# Patient Record
Sex: Male | Born: 1955 | Race: White | Hispanic: No | Marital: Married
Health system: Southern US, Community
[De-identification: ages and names within clinical notes are randomized; demographics above are authoritative.]

---

## 2015-07-24 ENCOUNTER — Ambulatory Visit
Admission: RE | Admit: 2015-07-24 | Discharge: 2015-07-24 | Disposition: A | Discharge status: Home / Self Care | Payer: Commercial Managed Care - HMO | Source: Ambulatory Visit | Attending: Internal Medicine | Admitting: Internal Medicine

## 2015-07-24 ENCOUNTER — Other Ambulatory Visit: Payer: Self-pay | Admitting: Internal Medicine

## 2015-07-24 DIAGNOSIS — M25562 Pain in left knee: Principal | ICD-10-CM

## 2015-07-24 DIAGNOSIS — G8929 Other chronic pain: Secondary | ICD-10-CM | POA: Diagnosis not present

## 2015-07-24 DIAGNOSIS — M25561 Pain in right knee: Secondary | ICD-10-CM

## 2015-07-24 DIAGNOSIS — M79672 Pain in left foot: Secondary | ICD-10-CM

## 2015-07-24 DIAGNOSIS — M79671 Pain in right foot: Secondary | ICD-10-CM | POA: Diagnosis not present

## 2015-07-24 DIAGNOSIS — M25461 Effusion, right knee: Secondary | ICD-10-CM | POA: Insufficient documentation

## 2015-07-24 DIAGNOSIS — M109 Gout, unspecified: Secondary | ICD-10-CM | POA: Diagnosis not present

## 2017-02-23 IMAGING — CR DG KNEE COMPLETE 4+V*L*
1 series · 4 of 4 positions shown · non-contrast
Comparison: No prior.

CLINICAL DATA: Chronic knee pain. History of gout. Initial
evaluation.

EXAM:
LEFT KNEE - COMPLETE 4+ VIEW

[Series 1: dg knee complete 4 views left · 0.14mm/px · 4 of 4 slices shown]
[im 1/4]
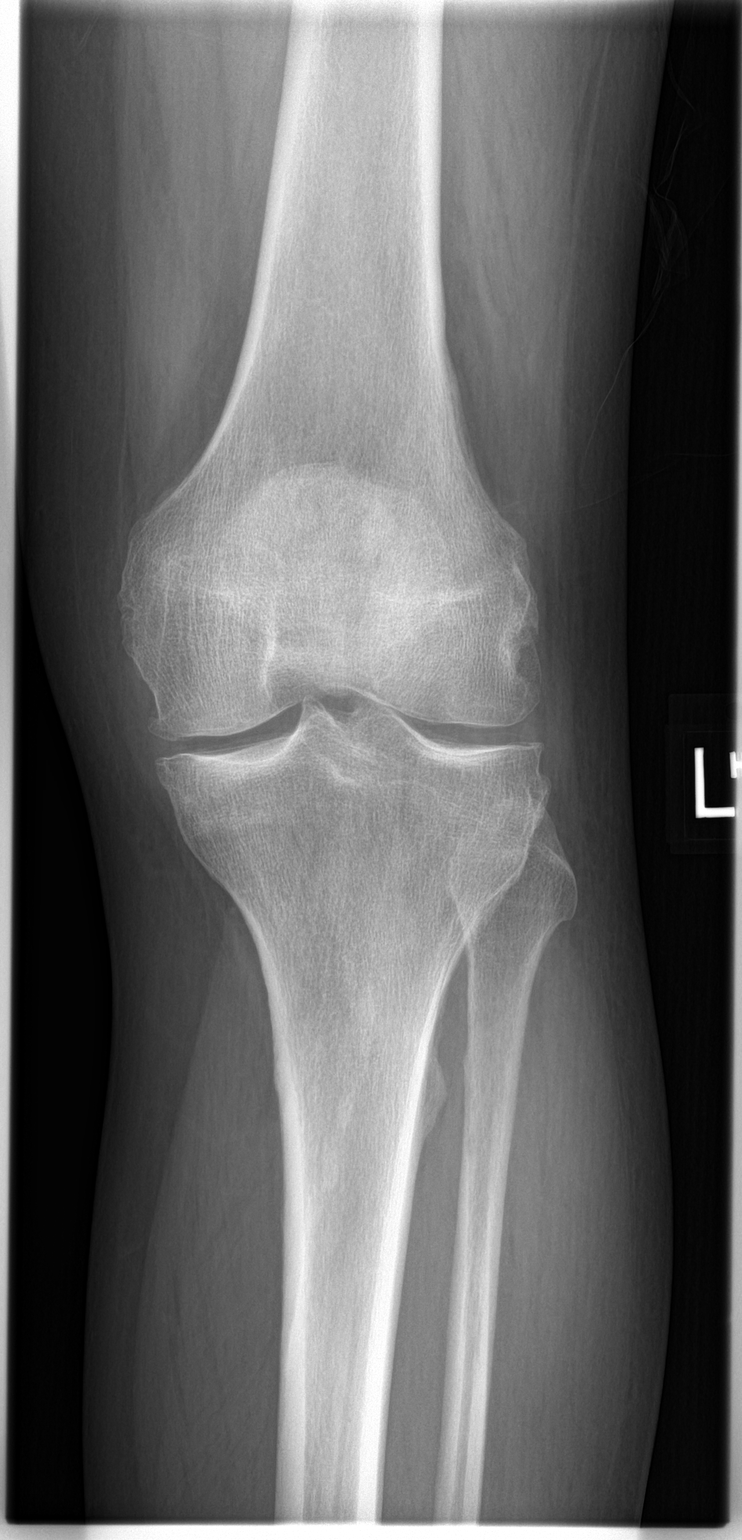
[im 2/4]
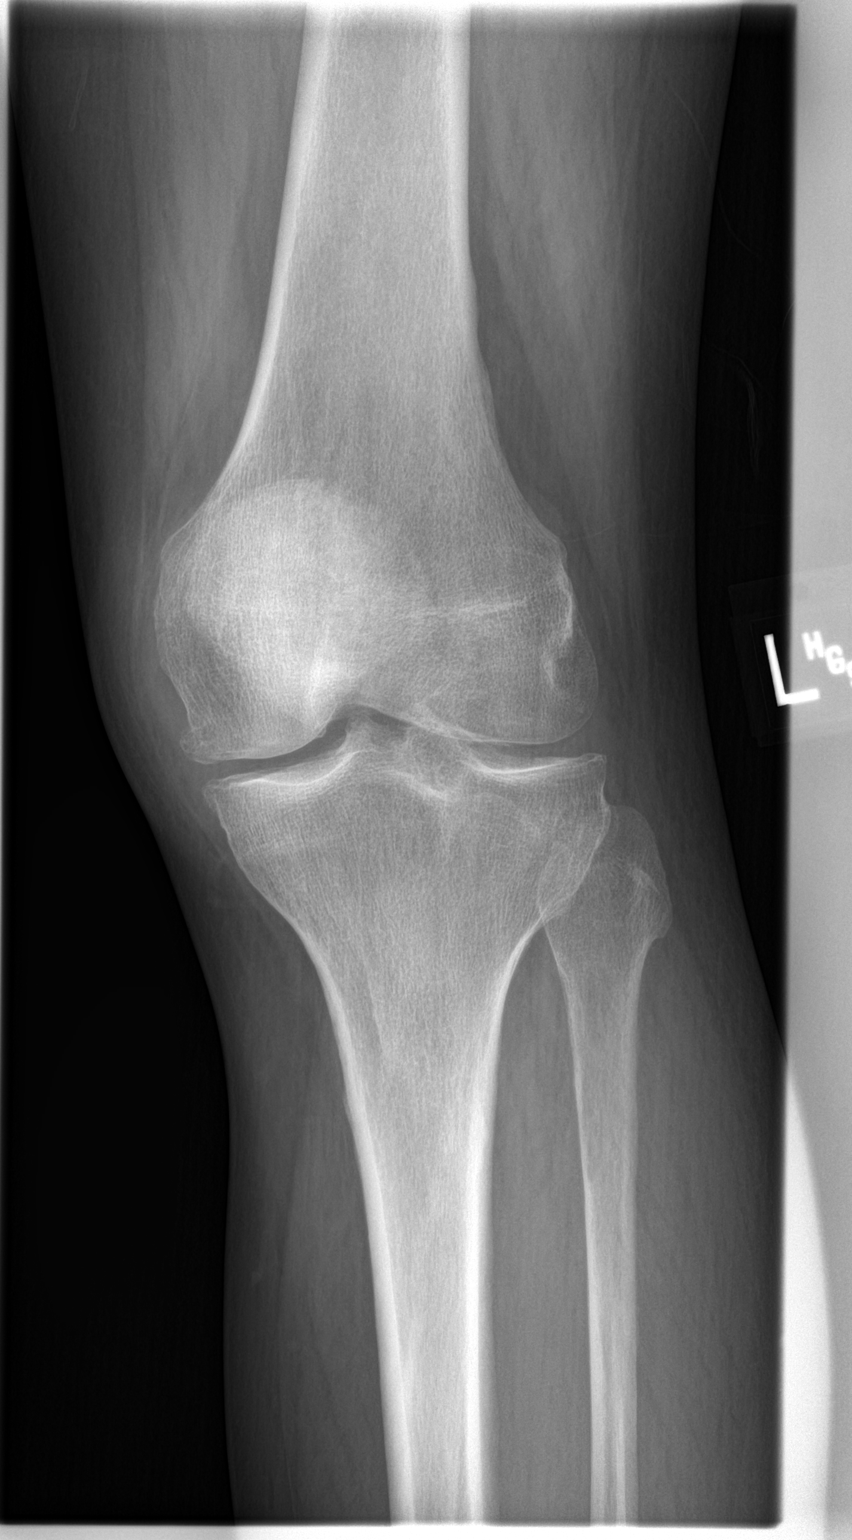
[im 3/4]
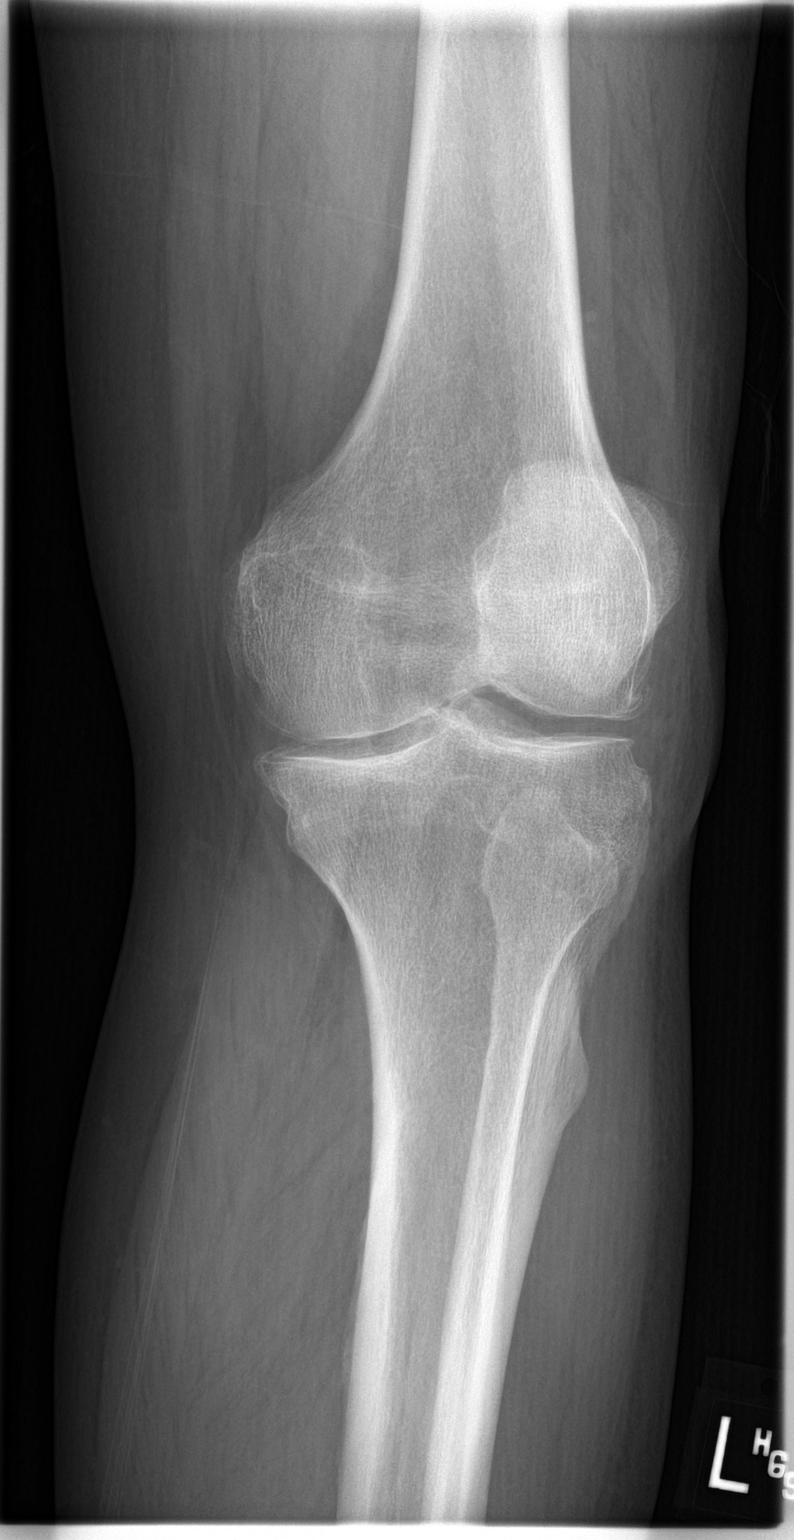
[im 4/4]
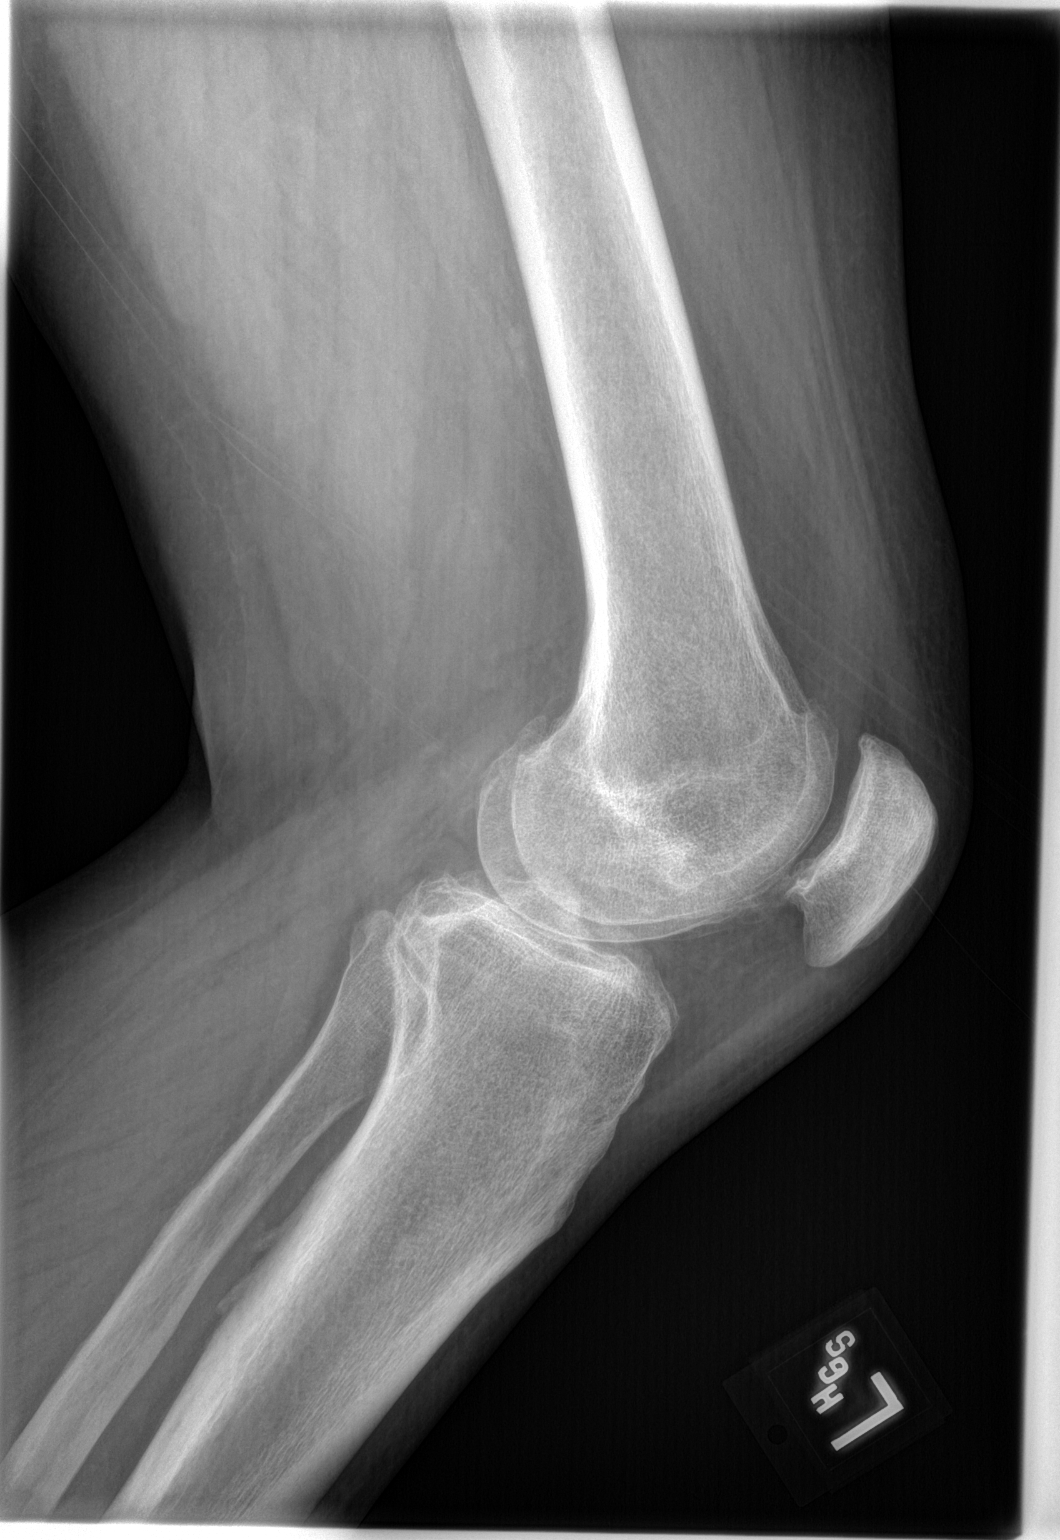

[4 of 4 positions shown; findings below may reference images not displayed]

FINDINGS: Tricompartment degenerative change. No acute bony abnormality
identified. No evidence of fracture or dislocation. Small knee joint
effusion.
IMPRESSION: 1. Small knee joint effusion.
2. Tricompartment degenerative change. No evidence of fracture or
dislocation.

## 2017-02-23 IMAGING — CR DG KNEE COMPLETE 4+V*R*
1 series · 4 of 4 positions shown · non-contrast
Comparison: No prior.

CLINICAL DATA: Bilateral knee pain.  History of gout.

EXAM:
RIGHT KNEE - COMPLETE 4+ VIEW

[Series 1: dg knee complete 4 views right · 0.14mm/px · 4 of 4 slices shown]
[im 1/4]
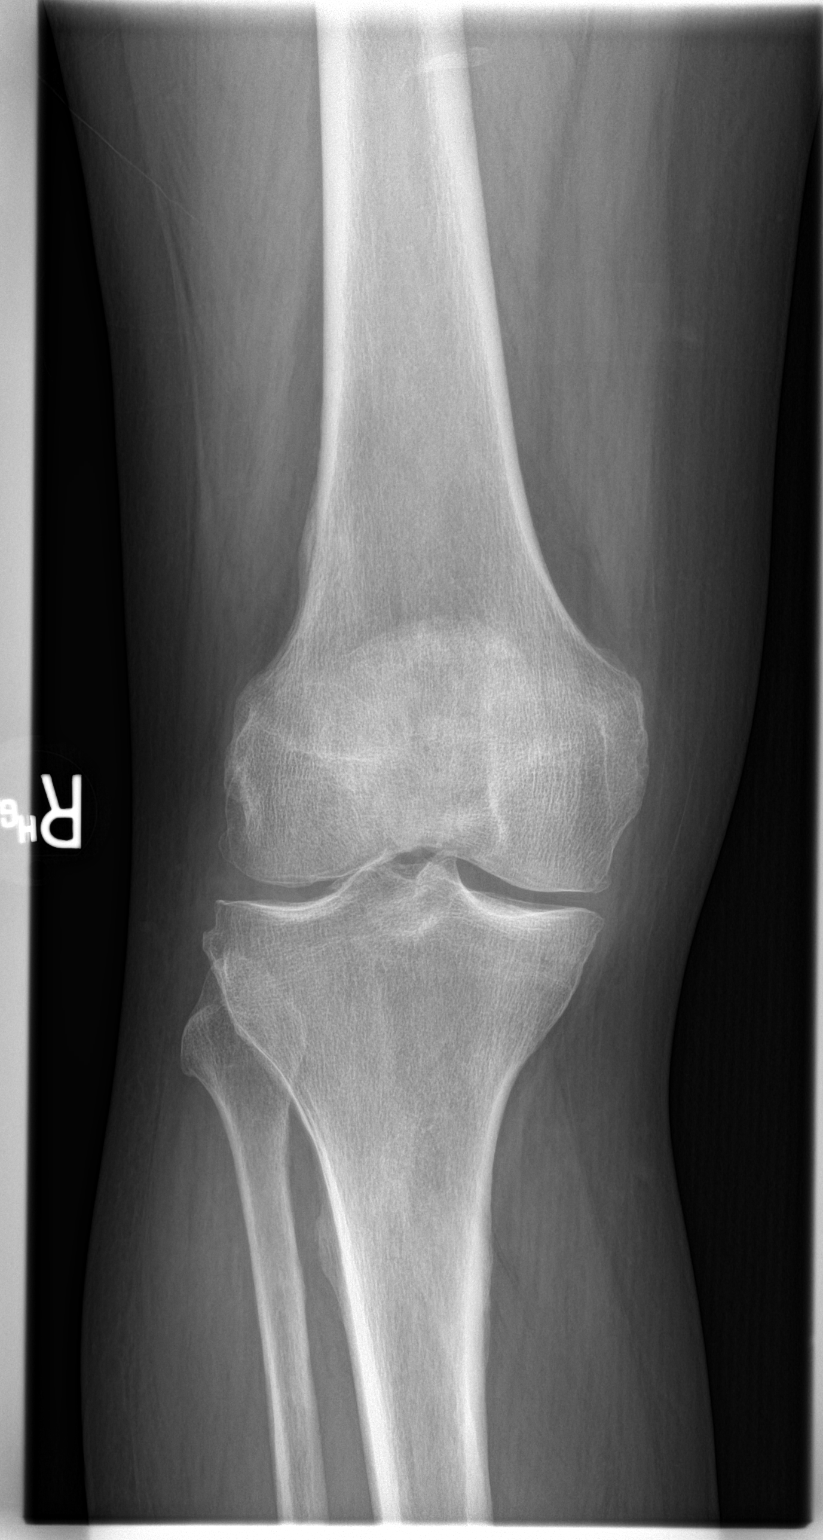
[im 2/4]
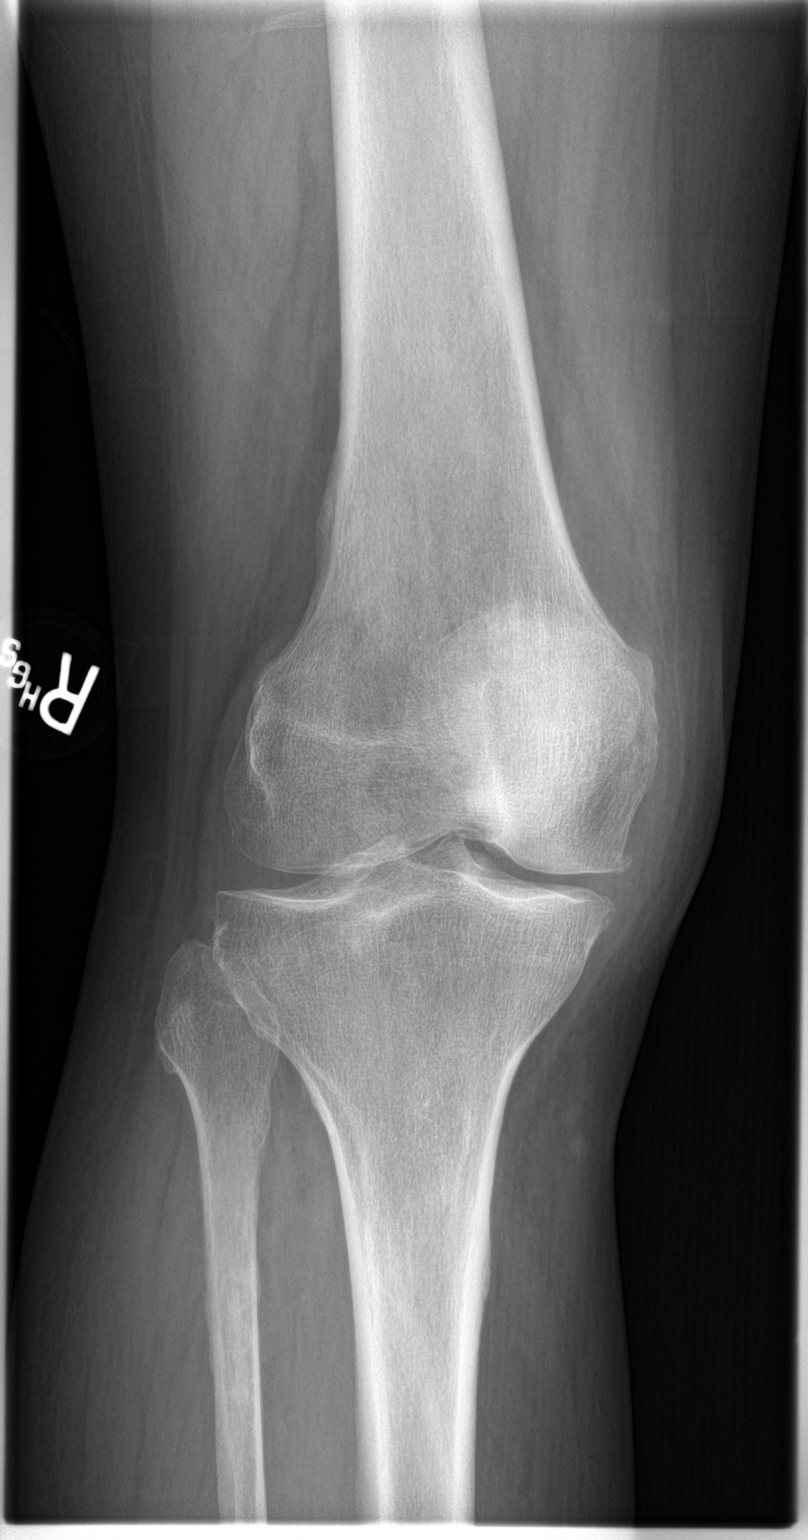
[im 3/4]
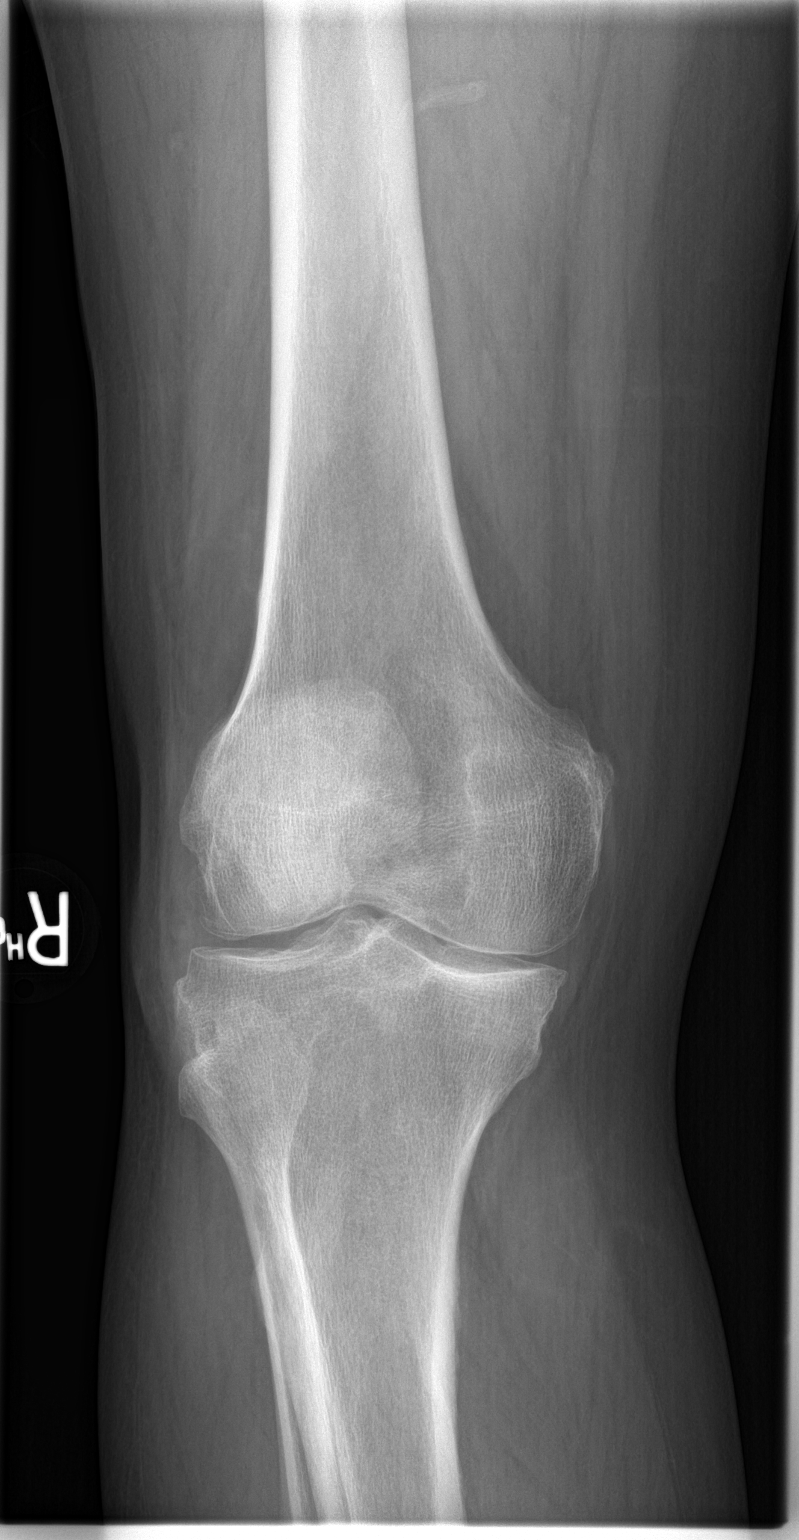
[im 4/4]
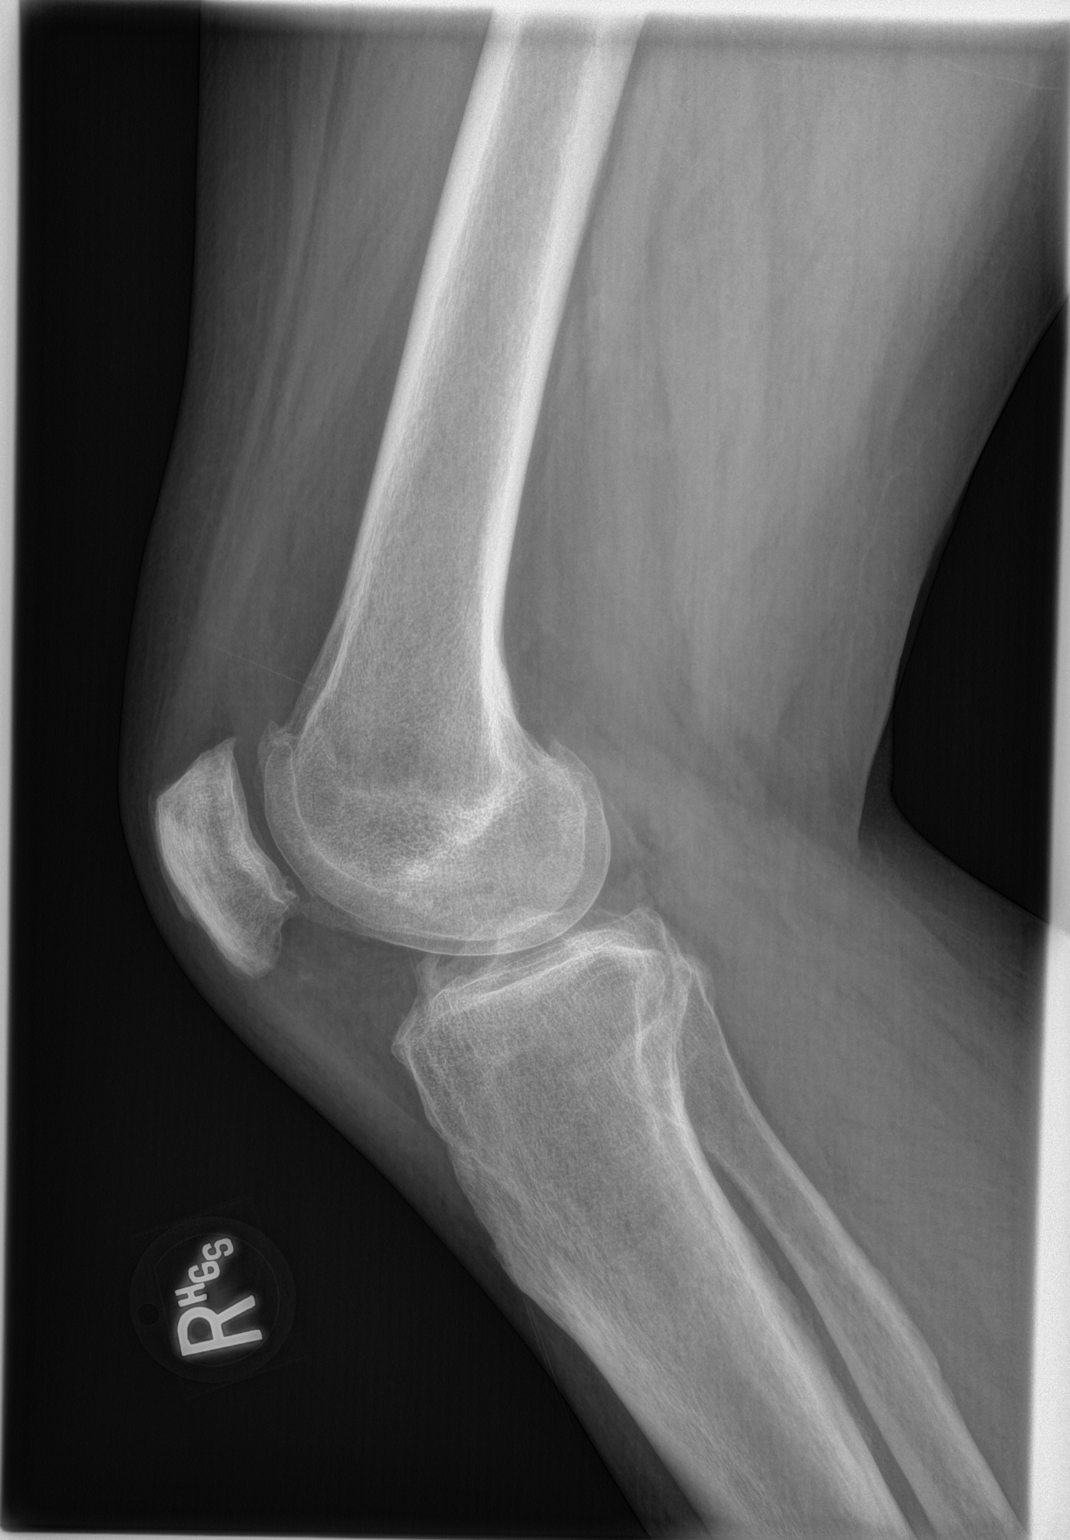

[4 of 4 positions shown; findings below may reference images not displayed]

FINDINGS: Tricompartment degenerative change. No acute bony or joint
abnormality identified. No evidence of fracture or dislocation.
Small knee joint effusion cannot be excluded.
IMPRESSION: 1. Small knee joint effusion cannot be excluded.
2. Tricompartment degenerative change. No acute bony abnormality
identified.

## 2017-02-23 IMAGING — CR DG FOOT COMPLETE 3+V*L*
1 series · 3 of 3 positions shown · non-contrast
Comparison: None.

CLINICAL DATA: Chronic bilateral foot pain.  Gout.

EXAM:
LEFT FOOT - COMPLETE 3+ VIEW

[Series 1: view not recorded · 0.14mm/px · 3 of 3 slices shown]
[im 1/3]
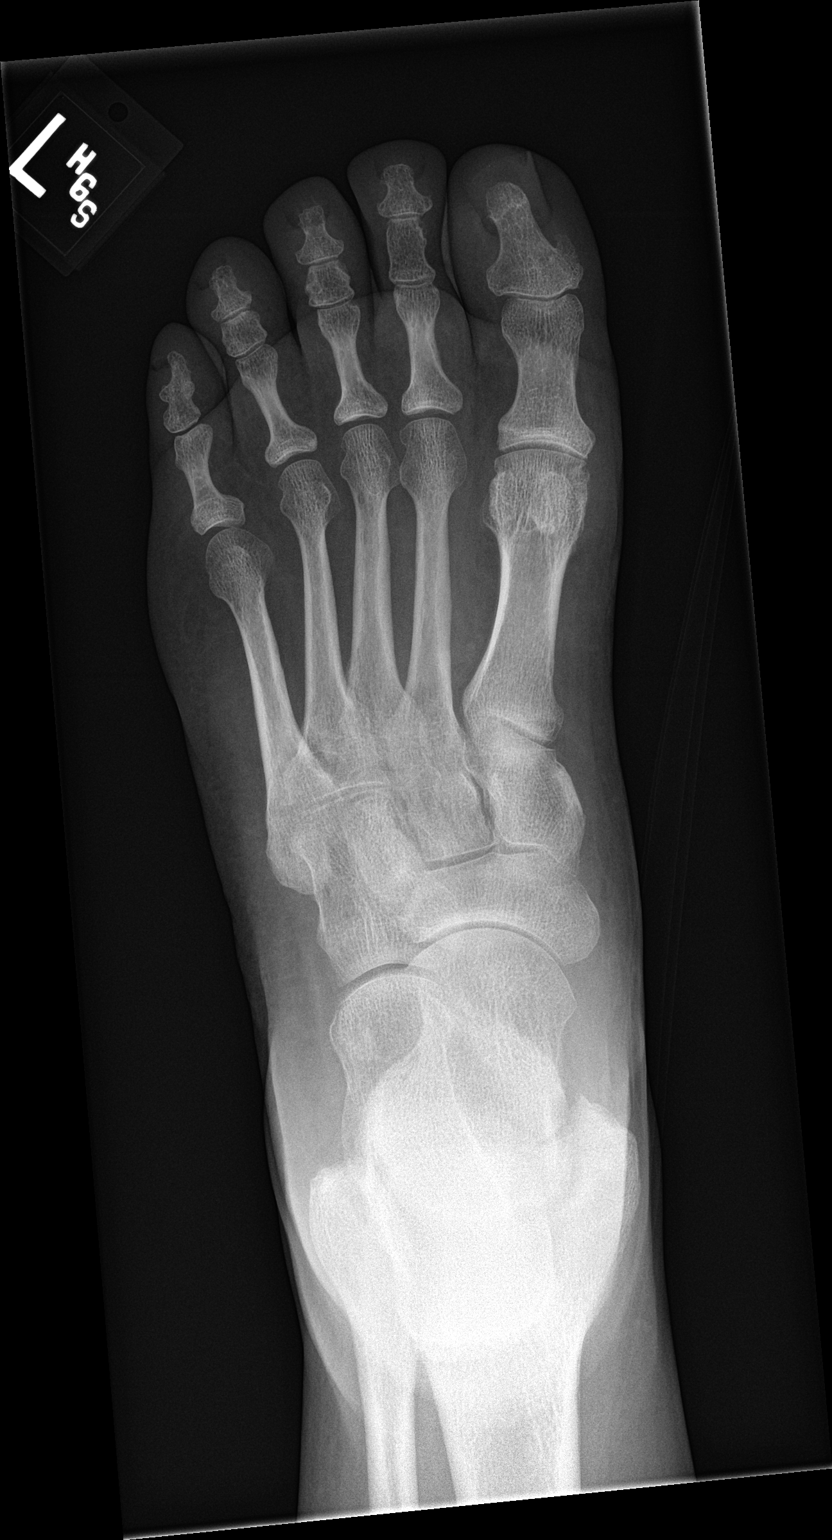
[im 2/3]
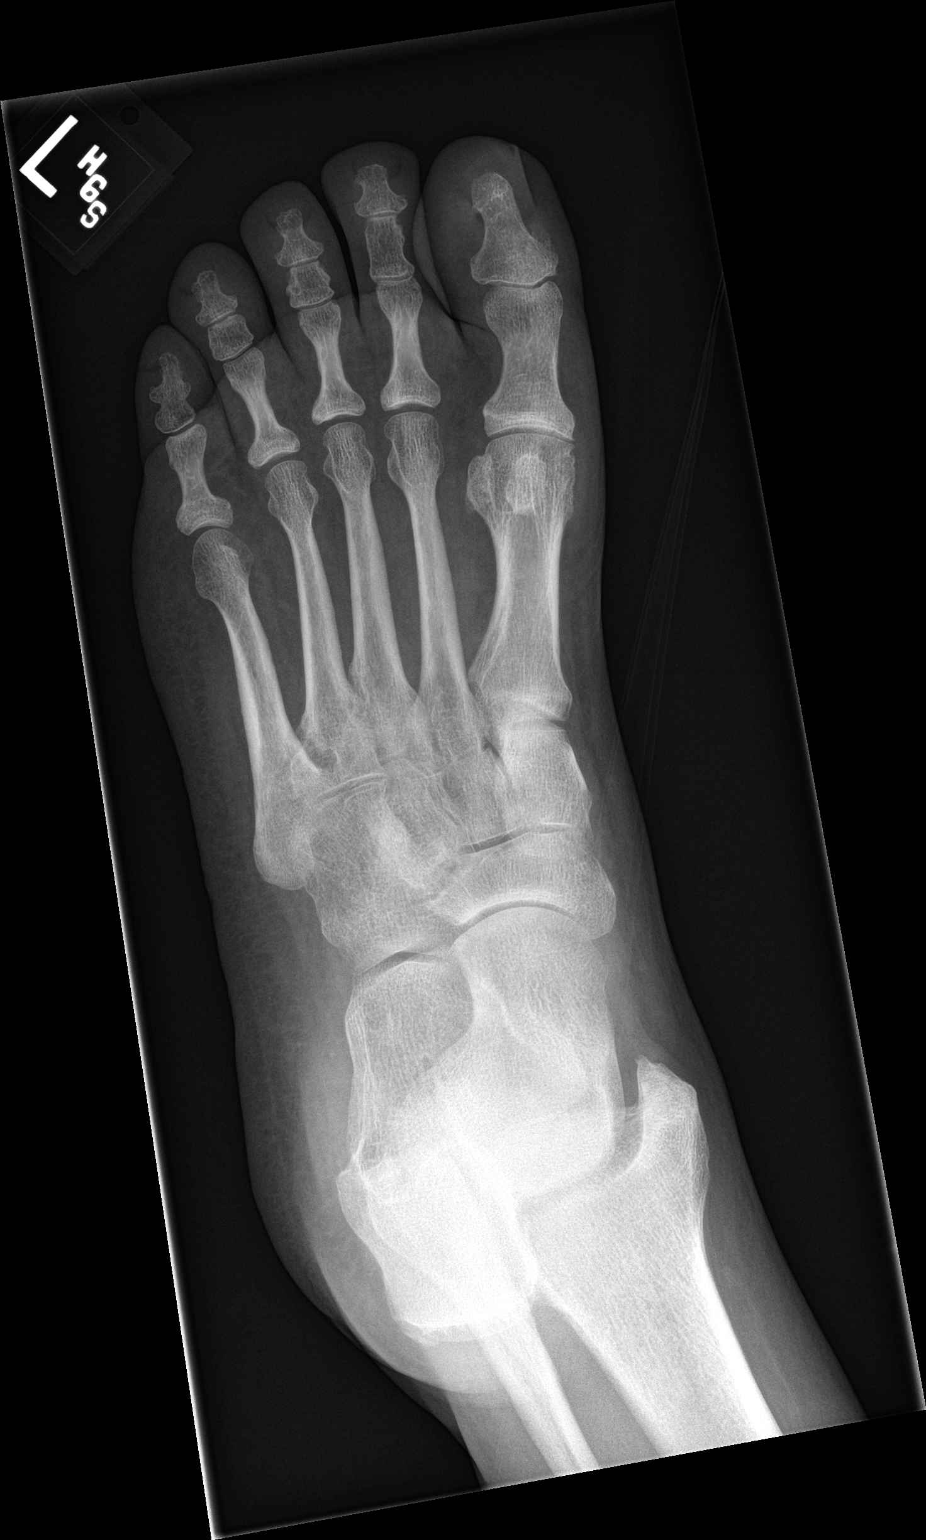
[im 3/3]
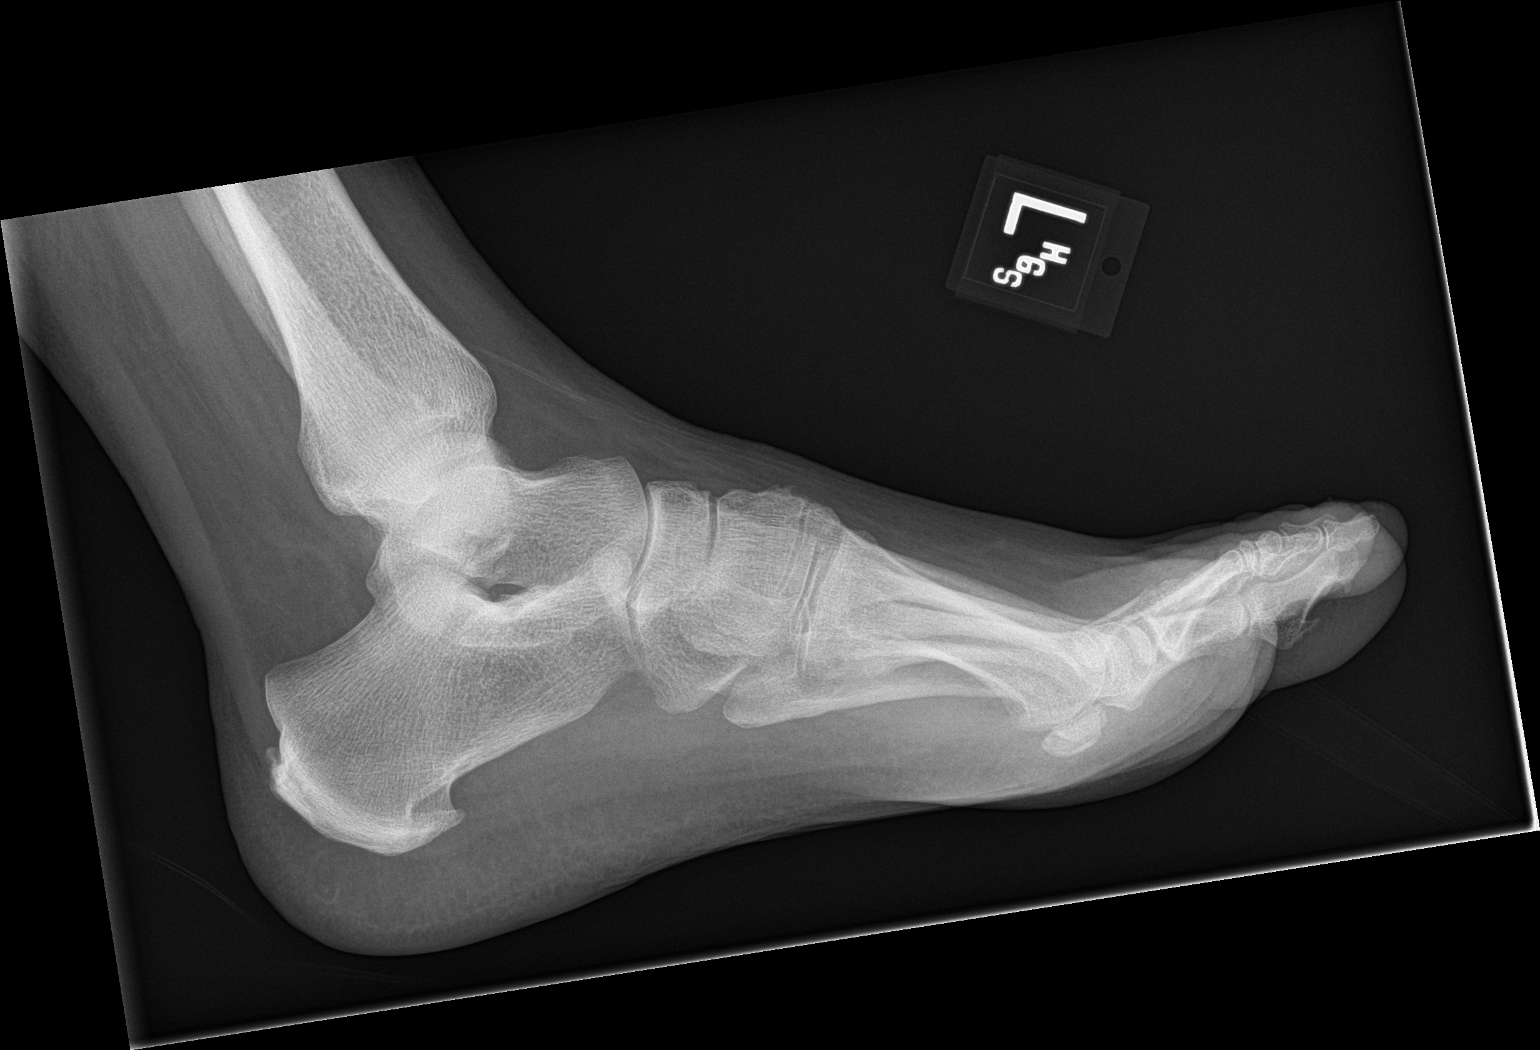

[3 of 3 positions shown; findings below may reference images not displayed]

FINDINGS: There is no evidence of fracture or dislocation. Mild degenerative
spurring and joint space narrowing seen involving the first
metatarsal-phalangeal joint. No other sites of arthropathy
identified. Small plantar calcaneal bone spurs noted. Soft tissues
are unremarkable.
IMPRESSION: No acute findings.

Mild degenerative changes involving first MTP joint.

## 2020-02-24 ENCOUNTER — Ambulatory Visit: Payer: Self-pay | Attending: Internal Medicine

## 2020-02-24 DIAGNOSIS — Z23 Encounter for immunization: Secondary | ICD-10-CM

## 2020-02-24 NOTE — Progress Notes (Signed)
   Covid-[facility] Vaccination [facility]  Name:  Austin Burns    MRN: [id number] DOB: Sep 18, 1955  02/24/2020  [name] was observed post Covid-19 immunization for 15 minutes without incident. He was provided with Vaccine Information Sheet and instruction to access the V-Safe system.   [name] was instructed to call 911 with any severe reactions post vaccine: Marland Kitchen Difficulty breathing  . Swelling of face and throat  . A fast heartbeat  . A bad rash all over body  . Dizziness and weakness   Immunizations Administered    Name Date Dose VIS Date Route   Pfizer COVID-19 Vaccine 02/24/2020  9:46 AM 0.3 mL 08/21/2018 Intramuscular   Manufacturer: [employer], [employer]   Lot: [id number]   NDC: [address]-7

## 2020-03-16 ENCOUNTER — Ambulatory Visit: Payer: Self-pay | Attending: Internal Medicine

## 2020-03-16 DIAGNOSIS — Z23 Encounter for immunization: Secondary | ICD-10-CM

## 2020-03-16 NOTE — Progress Notes (Signed)
   Covid-19 Vaccination Clinic  Name:  Austin Burns    MRN: [id number] DOB: 02-27-1956  03/16/2020  Austin Burns was observed post Covid-19 immunization for 15 minutes without incident. He was provided with Vaccine Information Sheet and instruction to access the V-Safe system.   Austin Burns was instructed to call 911 with any severe reactions post vaccine: Marland Kitchen Difficulty breathing  . Swelling of face and throat  . A fast heartbeat  . A bad rash all over body  . Dizziness and weakness   Immunizations Administered    Name Date Dose VIS Date Route   Pfizer COVID-19 Vaccine 03/16/2020 10:08 AM 0.3 mL 08/21/2018 Intramuscular   Manufacturer: [employer], [employer]   Lot: [id number]   NDC: [address]-2
